# Patient Record
Sex: Female | Born: 1975 | Hispanic: Yes | Smoking: Never smoker
Health system: Southern US, Community
[De-identification: ages and names within clinical notes are randomized; demographics above are authoritative.]

## PROBLEM LIST (undated history)

## (undated) DIAGNOSIS — E119 Type 2 diabetes mellitus without complications: Secondary | ICD-10-CM

## (undated) DIAGNOSIS — U071 COVID-19: Secondary | ICD-10-CM

## (undated) DIAGNOSIS — I959 Hypotension, unspecified: Secondary | ICD-10-CM

## (undated) HISTORY — DX: Type 2 diabetes mellitus without complications: E11.9

## (undated) HISTORY — DX: COVID-19: U07.1

## (undated) HISTORY — PX: NO PAST SURGERIES: SHX2092

## (undated) HISTORY — DX: Hypotension, unspecified: I95.9

---

## 1998-03-12 HISTORY — PX: OOPHORECTOMY: SHX86

## 2019-08-14 ENCOUNTER — Ambulatory Visit (INDEPENDENT_AMBULATORY_CARE_PROVIDER_SITE_OTHER): Payer: Self-pay | Admitting: Cardiology

## 2019-08-14 ENCOUNTER — Other Ambulatory Visit: Payer: Self-pay

## 2019-08-14 ENCOUNTER — Ambulatory Visit (INDEPENDENT_AMBULATORY_CARE_PROVIDER_SITE_OTHER): Payer: Self-pay

## 2019-08-14 ENCOUNTER — Encounter: Payer: Self-pay | Admitting: Cardiology

## 2019-08-14 VITALS — BP 96/62 | HR 78 | Ht 59.0 in | Wt 110.6 lb

## 2019-08-14 DIAGNOSIS — E1100 Type 2 diabetes mellitus with hyperosmolarity without nonketotic hyperglycemic-hyperosmolar coma (NKHHC): Secondary | ICD-10-CM

## 2019-08-14 DIAGNOSIS — R0602 Shortness of breath: Secondary | ICD-10-CM

## 2019-08-14 DIAGNOSIS — R002 Palpitations: Secondary | ICD-10-CM

## 2019-08-14 DIAGNOSIS — R079 Chest pain, unspecified: Secondary | ICD-10-CM

## 2019-08-14 DIAGNOSIS — I9589 Other hypotension: Secondary | ICD-10-CM

## 2019-08-14 MED ORDER — METOPROLOL TARTRATE 100 MG PO TABS
100.0000 mg | ORAL_TABLET | Freq: Once | ORAL | 0 refills | Status: AC
Start: 2019-08-14 — End: 2019-08-14

## 2019-08-14 MED ORDER — MIDODRINE HCL 2.5 MG PO TABS: 2.5000 mg | ORAL_TABLET | Freq: Three times a day (TID) | ORAL | 3 refills | Status: DC

## 2019-08-14 NOTE — Patient Instructions (Signed)
Medication Instructions:  Your physician has recommended you make the following change in your medication:  START: Midodrine 2.5 mg take one tablet by mouth every 8 hours.  *If you need a refill on your cardiac medications before your next appointment, please call your pharmacy*   Lab Work: Your physician recommends that you return for lab work in: TODAY BMP If you have labs (blood work) drawn today and your tests are completely normal, you will receive your results only by: Marland Kitchen MyChart Message (if you have MyChart) OR . A paper copy in the mail If you have any lab test that is abnormal or we need to change your treatment, we will call you to review the results.   Testing/Procedures: Your physician has requested that you have an echocardiogram. Echocardiography is a painless test that uses sound waves to create images of your heart. It provides your doctor with information about the size and shape of your heart and how well your heart's chambers and valves are working. This procedure takes approximately one hour. There are no restrictions for this procedure.  A zio monitor was ordered today. It will remain on for 14 days. You will then return monitor and event diary in provided box. It takes 1-2 weeks for report to be downloaded and returned to [place]. We will call you with the results. If monitor falls off or has orange flashing light, please call Zio for further instructions.   Your cardiac CT will be scheduled at the below location:   [facility] [address] [place],  [address] [phone]   If scheduled at [facility], please arrive at the [facility] main entrance of [facility] 30 minutes prior to test start time. Proceed to the [facility] Radiology Department (first floor) to check-in and test prep.  Please follow these instructions carefully (unless otherwise directed):  On the Night Before the Test: . Be sure to Drink plenty of  water. . Do not consume any caffeinated/decaffeinated beverages or chocolate 12 hours prior to your test. . Do not take any antihistamines 12 hours prior to your test. . If you take Metformin do not take 24 hours prior to test.  On the Day of the Test: . Drink plenty of water. Do not drink any water within one hour of the test. . Do not eat any food 4 hours prior to the test. . You may take your regular medications prior to the test.  . Take metoprolol (Lopressor) two hours prior to test. . HOLD Furosemide/Hydrochlorothiazide morning of the test. . FEMALES- please wear underwire-free bra if available       After the Test: . Drink plenty of water. . After receiving IV contrast, you may experience a mild flushed feeling. This is normal. . On occasion, you may experience a mild rash up to 24 hours after the test. This is not dangerous. If this occurs, you can take Benadryl 25 mg and increase your fluid intake. . If you experience trouble breathing, this can be serious. If it is severe call 911 IMMEDIATELY. If it is mild, please call our office. . If you take any of these medications: Glipizide/Metformin, Avandament, Glucavance, please do not take 48 hours after completing test unless otherwise instructed.   Once we have confirmed authorization from your insurance company, we will call you to set up a date and time for your test.   For non-scheduling related questions, please contact the cardiac imaging nurse navigator should you  have any questions/concerns: Marchia Bond, Cardiac Imaging Nurse Navigator Burley Saver, Interim Cardiac Imaging Nurse Navigator [facility] Direct Office Dial: [phone]   For scheduling needs, including cancellations and rescheduling, please call [phone].      Follow-Up: At [facility], you and your health needs are our priority.  As part of our continuing mission to provide you with exceptional heart care, we have created  designated Provider Care Teams.  These Care Teams include your primary Cardiologist (physician) and Advanced Practice Providers (APPs -  Physician Assistants and Nurse Practitioners) who all work together to provide you with the care you need, when you need it.  We recommend signing up for the patient portal called "MyChart".  Sign up information is provided on this After Visit Summary.  MyChart is used to connect with patients for Virtual Visits (Telemedicine).  Patients are able to view lab/test results, encounter notes, upcoming appointments, etc.  Non-urgent messages can be sent to your provider as well.   To learn more about what you can do with MyChart, go to [email or web address].    Your next appointment:   1 month(s)  The format for your next appointment:   In Person  Provider:   Berniece Salines, DO   Other Instructions

## 2019-08-14 NOTE — Progress Notes (Signed)
Cardiology Office Note:    Date:  08/14/2019   ID:  Catherine Guerrero, DOB 03-02-1976, MRN [id number]  PCP:  Maylon Cos, NP  Cardiologist:  Berniece Salines, DO  Electrophysiologist:  None   Referring MD: Maylon Cos, NP   " I have been experiencing palpitations, shortness of breath and intermittent chest pain"  History of Present Illness:    Catherine Guerrero is a 44 y.o. female with a hx of diabetes mellitus, COVID-19 infection, family history of premature coronary artery disease who presents today to be evaluated for palpitations, chest pain and shortness of breath. The patient is predominantly Spanish-speaking therefore [facility] in person interpreter Effie Shy was present and facilitated this visit. The patient reports that since her recovery for COVID-19 infection several months ago she has had worsening shortness of breath along with palpitations.  She notes that the palpitation abrupt onset of fast heartbeat which last for few seconds prior to resolution.  After this she experienced significant shortness of breath.  She also notes that she has had intermittent hypotension where she reported that her systolic blood pressure has been down in the 50s and she is felt significant lightheaded and feels as if she is going to pass out. Most recently she has had intermittent chest pain which is bothersome for her.  She tells me there is diffuse midsternal pain with no radiation.  It last for few seconds prior to resolution sometimes it lasts up to 5 minutes.  At times she feels as though pressure sensation and sometimes it feels as a tightness.  She is concerned because she has had premature family history of coronary artery disease and wants to get checked out.  Past Medical History:  Diagnosis Date  . COVID-19   . DM (diabetes mellitus) ([place])   . Hypotension     Past Surgical History:  Procedure Laterality Date  . OOPHORECTOMY  2000    Current Medications: Current Meds  Medication Sig   . Albuterol Sulfate (PROAIR RESPICLICK) 546 (90 Base) MCG/ACT AEPB Inhale 2 puffs into the lungs every 4 (four) hours as needed.  Marland Kitchen glipiZIDE (GLUCOTROL) 5 MG tablet Take 5 mg by mouth 2 (two) times daily.  . metFORMIN (GLUCOPHAGE) 1000 MG tablet Take 1,000 mg by mouth 2 (two) times daily with a meal.     Allergies:   Patient has no known allergies.   Social History   Socioeconomic History  . Marital status: Unknown    Spouse name: Not on file  . Number of children: Not on file  . Years of education: Not on file  . Highest education level: Not on file  Occupational History  . Not on file  Tobacco Use  . Smoking status: Never Smoker  . Smokeless tobacco: Never Used  Substance and Sexual Activity  . Alcohol use: Not on file  . Drug use: Not on file  . Sexual activity: Not on file  Other Topics Concern  . Not on file  Social History Narrative  . Not on file   Social Determinants of Health   Financial Resource Strain:   . Difficulty of Paying Living Expenses:   Food Insecurity:   . Worried About Charity fundraiser in the Last Year:   . Arboriculturist in the Last Year:   Transportation Needs:   . Film/video editor (Medical):   Marland Kitchen Lack of Transportation (Non-Medical):   Physical Activity:   . Days of Exercise per Week:   .  Minutes of Exercise per Session:   Stress:   . Feeling of Stress :   Social Connections:   . Frequency of Communication with Friends and Family:   . Frequency of Social Gatherings with Friends and Family:   . Attends Religious Services:   . Active Member of Clubs or Organizations:   . Attends Archivist Meetings:   Marland Kitchen Marital Status:      Family History: The patient's family history is not on file.  ROS:   Review of Systems  Constitution: Negative for decreased appetite, fever and weight gain.  HENT: Negative for congestion, ear discharge, hoarse voice and sore throat.   Eyes: Negative for discharge, redness, vision loss in  right eye and visual halos.  Cardiovascular: Reports chest pain, dyspnea on exertion and palpitations. negative for leg swelling orthopnea respiratory: Negative for cough, hemoptysis, shortness of breath and snoring.   Endocrine: Negative for heat intolerance and polyphagia.  Hematologic/Lymphatic: Negative for bleeding problem. Does not bruise/bleed easily.  Skin: Negative for flushing, nail changes, rash and suspicious lesions.  Musculoskeletal: Negative for arthritis, joint pain, muscle cramps, myalgias, neck pain and stiffness.  Gastrointestinal: Negative for abdominal pain, bowel incontinence, diarrhea and excessive appetite.  Genitourinary: Negative for decreased libido, genital sores and incomplete emptying.  Neurological: Negative for brief paralysis, focal weakness, headaches and loss of balance.  Psychiatric/Behavioral: Negative for altered mental status, depression and suicidal ideas.  Allergic/Immunologic: Negative for HIV exposure and persistent infections.    EKGs/Labs/Other Studies Reviewed:    The following studies were reviewed today:   EKG:  The ekg ordered today demonstrates sinus rhythm, heart rate 70 bpm, no prior EKG for comparison.  Recent Labs: Blood work done at her PCP office on Jul 22, 2019: Chemistry: Glucose 145, BUN 5, creatinine 0.46, sodium 139, potassium 4.5, chloride 104, bicarb 22, calcium 9.5 total protein 6.8, Albumin 4.4, total globulin 2.4, total bilirubin less than 0.2, alk phos 51, AST 16, ALT 13 TSH 2.06 /COVID-19 positive on May 21,2021  Recent Lipid Panel Lipid profile: Total cholesterol 122, triglyceride 87, HDL 49, LDL 56  Physical Exam:    VS:  BP 96/62   Pulse 78   Ht _0  (1.499 m)   Wt 110 lb 9.6 oz (50.2 kg)   SpO2 99%   BMI 22.34 kg/m     Wt Readings from Last 3 Encounters:  08/14/19 110 lb 9.6 oz (50.2 kg)     GEN: Well nourished, well developed in no acute distress HEENT: Normal NECK: No JVD; No carotid  bruits LYMPHATICS: No lymphadenopathy CARDIAC: S1S2 noted,RRR, no murmurs, rubs, gallops RESPIRATORY:  Clear to auscultation without rales, wheezing or rhonchi  ABDOMEN: Soft, non-tender, non-distended, +bowel sounds, no guarding. EXTREMITIES: No edema, No cyanosis, no clubbing MUSCULOSKELETAL:  No deformity  SKIN: Warm and dry NEUROLOGIC:  Alert and oriented x 3, non-focal PSYCHIATRIC:  Normal affect, good insight  ASSESSMENT:    1. Shortness of breath   2. Chest pain of uncertain etiology   3. Palpitations   4. Other specified hypotension   5. Type 2 diabetes mellitus with hyperosmolarity without coma, without long-term current use of insulin (HCC)    PLAN:    I would like to rule out a cardiovascular etiology of this palpitation, therefore at this time I would like to placed a zio patch for 14 days. In additon a transthoracic echocardiogram will be ordered to assess LV/RV function and any structural abnormalities. Once these testing have been performed  amd reviewed further reccomendations will be made. For now, I do reccomend that the patient goes to the nearest ED if  symptoms recur.  I would like to pursue an ischemic evaluation given her chest pain.  I am concerned about this due to her risk factors of diabetes and premature coronary disease in her family.  I do think at this time a coronary CTA would be reasonable.  I discussed with the patient about this testing.  She denies any IV contrast dye allergies and she is willing to proceed with this testing.  In addition I took her blood pressure manually in the office today she is orthostatic negative ( 98/62 mmHg, standing 96/60 mmHg).  But she tells me at home her systolic blood pressure is dropping into the 50s most times.  I like to try the patient on midodrine 2.5 mg every 8 hours to see if this is going to help.  I did educate her on how to take this medicine she is agreeable to try.  Type 2 diabetes continue her current  medication regimen by her PCP.   The patient is in agreement with the above plan. The patient left the office in stable condition.  The patient will follow up in 1 month or sooner if needed.   Medication Adjustments/Labs and Tests Ordered: Current medicines are reviewed at length with the patient today.  Concerns regarding medicines are outlined above.  Orders Placed This Encounter  Procedures  . CT CORONARY MORPH W/CTA COR W/SCORE W/CA W/CM &/OR WO/CM  . CT CORONARY FRACTIONAL FLOW RESERVE DATA PREP  . CT CORONARY FRACTIONAL FLOW RESERVE FLUID ANALYSIS  . Basic Metabolic Panel (BMET)  . LONG TERM MONITOR (3-14 DAYS)  . EKG 12-Lead  . ECHOCARDIOGRAM COMPLETE   Meds ordered this encounter  Medications  . midodrine (PROAMATINE) 2.5 MG tablet    Sig: Take 1 tablet (2.5 mg total) by mouth 3 (three) times daily with meals.    Dispense:  270 tablet    Refill:  3  . metoprolol tartrate (LOPRESSOR) 100 MG tablet    Sig: Take 1 tablet (100 mg total) by mouth once for 1 dose. Take two hours prior to cardiac CT    Dispense:  1 tablet    Refill:  0    Patient Instructions  Medication Instructions:  Your physician has recommended you make the following change in your medication:  START: Midodrine 2.5 mg take one tablet by mouth every 8 hours.  *If you need a refill on your cardiac medications before your next appointment, please call your pharmacy*   Lab Work: Your physician recommends that you return for lab work in: TODAY BMP If you have labs (blood work) drawn today and your tests are completely normal, you will receive your results only by: Marland Kitchen MyChart Message (if you have MyChart) OR . A paper copy in the mail If you have any lab test that is abnormal or we need to change your treatment, we will call you to review the results.   Testing/Procedures: Your physician has requested that you have an echocardiogram. Echocardiography is a painless test that uses sound waves to create  images of your heart. It provides your doctor with information about the size and shape of your heart and how well your heart's chambers and valves are working. This procedure takes approximately one hour. There are no restrictions for this procedure.  A zio monitor was ordered today. It will remain on for 14 days. You  will then return monitor and event diary in provided box. It takes 1-2 weeks for report to be downloaded and returned to [place]. We will call you with the results. If monitor falls off or has orange flashing light, please call Zio for further instructions.   Your cardiac CT will be scheduled at the below location:   [facility] [address] [place], [place] [address] [phone]   If scheduled at [facility], please arrive at the [facility] main entrance of [facility] 30 minutes prior to test start time. Proceed to the [facility] Radiology Department (first floor) to check-in and test prep.  Please follow these instructions carefully (unless otherwise directed):  On the Night Before the Test: . Be sure to Drink plenty of water. . Do not consume any caffeinated/decaffeinated beverages or chocolate 12 hours prior to your test. . Do not take any antihistamines 12 hours prior to your test. . If you take Metformin do not take 24 hours prior to test.  On the Day of the Test: . Drink plenty of water. Do not drink any water within one hour of the test. . Do not eat any food 4 hours prior to the test. . You may take your regular medications prior to the test.  . Take metoprolol (Lopressor) two hours prior to test. . HOLD Furosemide/Hydrochlorothiazide morning of the test. . FEMALES- please wear underwire-free bra if available       After the Test: . Drink plenty of water. . After receiving IV contrast, you may experience a mild flushed feeling. This is normal. . On occasion, you may experience a mild rash up to 24 hours after the test. This is  not dangerous. If this occurs, you can take Benadryl 25 mg and increase your fluid intake. . If you experience trouble breathing, this can be serious. If it is severe call 911 IMMEDIATELY. If it is mild, please call our office. . If you take any of these medications: Glipizide/Metformin, Avandament, Glucavance, please do not take 48 hours after completing test unless otherwise instructed.   Once we have confirmed authorization from your insurance company, we will call you to set up a date and time for your test.   For non-scheduling related questions, please contact the cardiac imaging nurse navigator should you have any questions/concerns: Marchia Bond, Cardiac Imaging Nurse Navigator Burley Saver, Interim Cardiac Imaging Nurse [facility] Direct Office Dial: [phone]   For scheduling needs, including cancellations and rescheduling, please call [phone].      Follow-Up: At [facility], you and your health needs are our priority.  As part of our continuing mission to provide you with exceptional heart care, we have created designated Provider Care Teams.  These Care Teams include your primary Cardiologist (physician) and Advanced Practice Providers (APPs -  Physician Assistants and Nurse Practitioners) who all work together to provide you with the care you need, when you need it.  We recommend signing up for the patient portal called "MyChart".  Sign up information is provided on this After Visit Summary.  MyChart is used to connect with patients for Virtual Visits (Telemedicine).  Patients are able to view lab/test results, encounter notes, upcoming appointments, etc.  Non-urgent messages can be sent to your provider as well.   To learn more about what you can do with MyChart, go to [email or web address].    Your next appointment:   1 month(s)  The format for your  next appointment:   In Person  Provider:   Berniece Salines, DO   Other  Instructions      Adopting a Healthy Lifestyle.  Know what a healthy weight is for you (roughly BMI <25) and aim to maintain this   Aim for 7+ servings of fruits and vegetables daily   65-80+ fluid ounces of water or unsweet tea for healthy kidneys   Limit to max 1 drink of alcohol per day; avoid smoking/tobacco   Limit animal fats in diet for cholesterol and heart health - choose grass fed whenever available   Avoid highly processed foods, and foods high in saturated/trans fats   Aim for low stress - take time to unwind and care for your mental health   Aim for 150 min of moderate intensity exercise weekly for heart health, and weights twice weekly for bone health   Aim for 7-9 hours of sleep daily   When it comes to diets, agreement about the perfect plan isnt easy to find, even among the experts. Experts at the [employer] developed an idea known as the Healthy Eating Plate. Just imagine a plate divided into logical, healthy portions.   The emphasis is on diet quality:   Load up on vegetables and fruits - one-half of your plate: Aim for color and variety, and remember that potatoes dont count.   Go for whole grains - one-quarter of your plate: Whole wheat, barley, wheat berries, quinoa, oats, brown rice, and foods made with them. If you want pasta, go with whole wheat pasta.   Protein power - one-quarter of your plate: Fish, chicken, beans, and nuts are all healthy, versatile protein sources. Limit red meat.   The diet, however, does go beyond the plate, offering a few other suggestions.   Use healthy plant oils, such as olive, canola, soy, corn, sunflower and peanut. Check the labels, and avoid partially hydrogenated oil, which have unhealthy trans fats.   If youre thirsty, drink water. Coffee and tea are good in moderation, but skip sugary drinks and limit milk and dairy products to one or two daily servings.   The type of carbohydrate in the diet  is more important than the amount. Some sources of carbohydrates, such as vegetables, fruits, whole grains, and beans-are healthier than others.   Finally, stay active  Signed, Berniece Salines, DO  08/14/2019 10:27 PM    [place]

## 2019-08-15 LAB — BASIC METABOLIC PANEL
BUN/Creatinine Ratio: 17 (ref 9–23)
BUN: 10 mg/dL (ref 6–24)
CO2: 24 mmol/L (ref 20–29)
Calcium: 10.2 mg/dL (ref 8.7–10.2)
Chloride: 100 mmol/L (ref 96–106)
Creatinine, Ser: 0.59 mg/dL (ref 0.57–1.00)
GFR calc Af Amer: 130 mL/min/{1.73_m2} (ref >59)
GFR calc non Af Amer: 113 mL/min/{1.73_m2} (ref >59)
Glucose: 132 mg/dL — ABNORMAL HIGH (ref 65–99)
Potassium: 5.2 mmol/L (ref 3.5–5.2)
Sodium: 139 mmol/L (ref 134–144)

## 2019-08-17 ENCOUNTER — Telehealth: Payer: Self-pay

## 2019-08-17 NOTE — Telephone Encounter (Signed)
-----   Message from Berniece Salines, DO sent at 08/15/2019 10:30 AM EDT ----- Blood work shows elevated glucose but otherwise normal.

## 2019-08-17 NOTE — Telephone Encounter (Signed)
Left message on patients voicemail to please return our call.   

## 2019-08-18 NOTE — Telephone Encounter (Signed)
Spoke with patient regarding results and recommendation.  Patient verbalizes understanding and is agreeable to plan of care. Advised patient to call back with any issues or concerns.  

## 2019-09-04 ENCOUNTER — Ambulatory Visit (INDEPENDENT_AMBULATORY_CARE_PROVIDER_SITE_OTHER): Payer: Self-pay

## 2019-09-04 ENCOUNTER — Other Ambulatory Visit: Payer: Self-pay

## 2019-09-04 DIAGNOSIS — R0602 Shortness of breath: Secondary | ICD-10-CM

## 2019-09-04 NOTE — Progress Notes (Signed)
2D Echocardiogram has been performed. [facility] Cyla Haluska RDCS 3:39 09/04/19

## 2019-09-07 ENCOUNTER — Telehealth: Payer: Self-pay

## 2019-09-07 NOTE — Telephone Encounter (Signed)
Spoke with patient regarding results and recommendation.  Patient verbalizes understanding and is agreeable to plan of care. Advised patient to call back with any issues or concerns.  

## 2019-09-07 NOTE — Telephone Encounter (Signed)
-----   Message from Loel Dubonnet, NP sent at 09/07/2019  7:44 AM EDT ----- Echocardiogram shows normal heart pumping function. No significant valvular abnormalities. Good result!

## 2019-09-18 ENCOUNTER — Ambulatory Visit: Payer: Self-pay | Admitting: Cardiology

## 2019-09-21 ENCOUNTER — Telehealth: Payer: Self-pay

## 2019-09-21 NOTE — Telephone Encounter (Signed)
-----   Message from Berniece Salines, DO sent at 09/21/2019 10:34 AM EDT ----- Doristine Devoid news -ZIO monitor was normal with no evidence of arrhythmias.

## 2019-09-21 NOTE — Telephone Encounter (Signed)
Spoke with patient regarding results and recommendation.  Patient verbalizes understanding and is agreeable to plan of care. Advised patient to call back with any issues or concerns.  

## 2020-02-25 ENCOUNTER — Other Ambulatory Visit: Payer: Self-pay | Admitting: *Deleted

## 2020-02-25 DIAGNOSIS — Z1231 Encounter for screening mammogram for malignant neoplasm of breast: Secondary | ICD-10-CM

## 2020-02-29 ENCOUNTER — Ambulatory Visit
Admission: RE | Admit: 2020-02-29 | Discharge: 2020-02-29 | Disposition: A | Discharge status: Home / Self Care | Payer: No Typology Code available for payment source | Source: Ambulatory Visit | Attending: Nurse Practitioner | Admitting: Nurse Practitioner

## 2020-02-29 ENCOUNTER — Other Ambulatory Visit: Payer: Self-pay

## 2020-02-29 DIAGNOSIS — Z1231 Encounter for screening mammogram for malignant neoplasm of breast: Secondary | ICD-10-CM

## 2021-07-16 ENCOUNTER — Encounter (HOSPITAL_BASED_OUTPATIENT_CLINIC_OR_DEPARTMENT_OTHER): Payer: Self-pay

## 2021-07-16 ENCOUNTER — Other Ambulatory Visit: Payer: Self-pay

## 2021-07-16 ENCOUNTER — Emergency Department (HOSPITAL_BASED_OUTPATIENT_CLINIC_OR_DEPARTMENT_OTHER)
Admission: EM | Admit: 2021-07-16 | Discharge: 2021-07-16 | Disposition: A | Discharge status: Home / Self Care | Payer: 59 | Attending: Emergency Medicine | Admitting: Emergency Medicine

## 2021-07-16 ENCOUNTER — Emergency Department (HOSPITAL_BASED_OUTPATIENT_CLINIC_OR_DEPARTMENT_OTHER): Payer: 59

## 2021-07-16 DIAGNOSIS — R110 Nausea: Secondary | ICD-10-CM | POA: Insufficient documentation

## 2021-07-16 DIAGNOSIS — S76212A Strain of adductor muscle, fascia and tendon of left thigh, initial encounter: Secondary | ICD-10-CM | POA: Insufficient documentation

## 2021-07-16 DIAGNOSIS — I10 Essential (primary) hypertension: Secondary | ICD-10-CM | POA: Diagnosis not present

## 2021-07-16 DIAGNOSIS — S79922A Unspecified injury of left thigh, initial encounter: Secondary | ICD-10-CM | POA: Diagnosis present

## 2021-07-16 DIAGNOSIS — R1032 Left lower quadrant pain: Secondary | ICD-10-CM | POA: Diagnosis not present

## 2021-07-16 DIAGNOSIS — Z79899 Other long term (current) drug therapy: Secondary | ICD-10-CM | POA: Insufficient documentation

## 2021-07-16 DIAGNOSIS — E119 Type 2 diabetes mellitus without complications: Secondary | ICD-10-CM | POA: Insufficient documentation

## 2021-07-16 DIAGNOSIS — X58XXXA Exposure to other specified factors, initial encounter: Secondary | ICD-10-CM | POA: Diagnosis not present

## 2021-07-16 DIAGNOSIS — Z7984 Long term (current) use of oral hypoglycemic drugs: Secondary | ICD-10-CM | POA: Insufficient documentation

## 2021-07-16 LAB — URINALYSIS, ROUTINE W REFLEX MICROSCOPIC
Bilirubin Urine: NEGATIVE
Glucose, UA: NEGATIVE mg/dL
Hgb urine dipstick: NEGATIVE
Ketones, ur: NEGATIVE mg/dL
Leukocytes,Ua: NEGATIVE
Nitrite: NEGATIVE
Specific Gravity, Urine: 1.02 (ref 1.005–1.030)
pH: 6.5 (ref 5.0–8.0)

## 2021-07-16 LAB — CBC
HCT: 32.7 % — ABNORMAL LOW (ref 36.0–46.0)
Hemoglobin: 10.7 g/dL — ABNORMAL LOW (ref 12.0–15.0)
MCH: 26.2 pg (ref 26.0–34.0)
MCHC: 32.7 g/dL (ref 30.0–36.0)
MCV: 80.1 fL (ref 80.0–100.0)
Platelets: 267 10*3/uL (ref 150–400)
RBC: 4.08 MIL/uL (ref 3.87–5.11)
RDW: 14.6 % (ref 11.5–15.5)
WBC: 10.9 10*3/uL — ABNORMAL HIGH (ref 4.0–10.5)
nRBC: 0 % (ref 0.0–0.2)

## 2021-07-16 LAB — COMPREHENSIVE METABOLIC PANEL
ALT: 7 U/L (ref 0–44)
AST: 10 U/L — ABNORMAL LOW (ref 15–41)
Albumin: 4.3 g/dL (ref 3.5–5.0)
Alkaline Phosphatase: 31 U/L — ABNORMAL LOW (ref 38–126)
Anion gap: 10 (ref 5–15)
BUN: 11 mg/dL (ref 6–20)
CO2: 23 mmol/L (ref 22–32)
Calcium: 9.6 mg/dL (ref 8.9–10.3)
Chloride: 107 mmol/L (ref 98–111)
Creatinine, Ser: 0.5 mg/dL (ref 0.44–1.00)
GFR, Estimated: >60 mL/min (ref >60)
Glucose, Bld: 59 mg/dL — ABNORMAL LOW (ref 70–99)
Potassium: 4 mmol/L (ref 3.5–5.1)
Sodium: 140 mmol/L (ref 135–145)
Total Bilirubin: 0.3 mg/dL (ref 0.3–1.2)
Total Protein: 6.8 g/dL (ref 6.5–8.1)

## 2021-07-16 LAB — CBG MONITORING, ED: Glucose-Capillary: 84 mg/dL (ref 70–99)

## 2021-07-16 LAB — PREGNANCY, URINE: Preg Test, Ur: NEGATIVE

## 2021-07-16 LAB — LIPASE, BLOOD: Lipase: 40 U/L (ref 11–51)

## 2021-07-16 MED ORDER — ONDANSETRON HCL 4 MG/2ML IJ SOLN
4.0000 mg | Freq: Once | INTRAMUSCULAR | Status: DC
  Filled 2021-07-16: qty 2

## 2021-07-16 MED ORDER — IBUPROFEN 600 MG PO TABS: 600.0000 mg | ORAL_TABLET | Freq: Four times a day (QID) | ORAL | 0 refills | Status: AC | PRN

## 2021-07-16 MED ORDER — ACETAMINOPHEN 500 MG PO TABS
1000.0000 mg | ORAL_TABLET | Freq: Once | ORAL | Status: AC
  Administered 2021-07-16: 1000 mg via ORAL
  Filled 2021-07-16: qty 2

## 2021-07-16 MED ORDER — IOHEXOL 300 MG/ML  SOLN
100.0000 mL | Freq: Once | INTRAMUSCULAR | Status: AC | PRN
  Administered 2021-07-16: 100 mL via INTRAVENOUS

## 2021-07-16 MED ORDER — MORPHINE SULFATE (PF) 4 MG/ML IV SOLN
4.0000 mg | Freq: Once | INTRAVENOUS | Status: DC
  Filled 2021-07-16: qty 1

## 2021-07-16 MED ORDER — CYCLOBENZAPRINE HCL 10 MG PO TABS: 10.0000 mg | ORAL_TABLET | Freq: Two times a day (BID) | ORAL | 0 refills | Status: DC | PRN

## 2021-07-16 NOTE — ED Triage Notes (Signed)
Pt presents with a 3 day hx of LLQ abd pain with radiation down L leg. Pain is palliated when lying down with a pillow under L leg. Pt reports nausea. Denies vomiting, diarrhea or urinary symptoms. Pt has tried OTC pain medication without relief.  ?

## 2021-07-16 NOTE — ED Provider Notes (Signed)
?[facility] ?Provider Note ? ? ?CSN: [id number] ?Arrival date & time: 07/16/21  1337 ? ?  ? ?History ? ?Chief Complaint  ?Patient presents with  ? Abdominal Pain  ? ? ?Catherine Guerrero is a 46 y.o. female. ? ?The history is provided by the patient and medical records. The history is limited by a language barrier. A language interpreter was used.  ?Abdominal Pain ? ?46 year old Hispanic female with history of diabetes, hypertension, presenting with complaints of abdominal pain.  History obtained through daughter who serves as a language interpreter per patient request.  For the past few days patient has had pain to her left lower abdomen and left groin area that radiates down her left leg as well as up left arm.  Pain is sharp throbbing seems to be persistent and worsening with movement.  There are only a few positions that she feels more comfortable with.  She tries taking over-the-counter medication including Tylenol and ibuprofen without adequate relief.  She does not endorse any fever or chills no nausea vomiting or diarrhea no urinary symptoms no blood in urine no change in her bowel bladder status.  The pain is intense she does endorse a mild nausea.  She has had somewhat of a similar symptoms like this in the past.  Currently rates the pain a 6 out of 10.  No back pain.  No recent trauma ? ?Home Medications ?Prior to Admission medications   ?Medication Sig Start Date End Date Taking? Authorizing Provider  ?Albuterol Sulfate (PROAIR RESPICLICK) 163 (90 Base) MCG/ACT AEPB Inhale 2 puffs into the lungs every 4 (four) hours as needed.    [provider]  ?glipiZIDE (GLUCOTROL) 5 MG tablet Take 5 mg by mouth 2 (two) times daily.    [provider]  ?metFORMIN (GLUCOPHAGE) 1000 MG tablet Take 1,000 mg by mouth 2 (two) times daily with a meal.    [provider]  ?metoprolol tartrate (LOPRESSOR) 100 MG tablet Take 1 tablet (100 mg total) by mouth once for 1 dose.  Take two hours prior to cardiac CT 08/14/19 08/14/19  Tobb, Godfrey Pick, DO  ?midodrine (PROAMATINE) 2.5 MG tablet Take 1 tablet (2.5 mg total) by mouth 3 (three) times daily with meals. 08/14/19   Berniece Salines, DO  ?   ? ?Allergies    ?Patient has no known allergies.   ? ?Review of Systems   ?Review of Systems  ?Gastrointestinal:  Positive for abdominal pain.  ?All other systems reviewed and are negative. ? ?Physical Exam ?Updated Vital Signs ?BP 114/75   Pulse 79   Temp 98.8 ?F (37.1 ?C)   Resp 16   Ht '4\' 10"'$  (1.473 m)   Wt 52.2 kg   SpO2 100%   BMI 24.04 kg/m?  ?Physical Exam ?Vitals and nursing note reviewed.  ?Constitutional:   ?   General: She is not in acute distress. ?   Appearance: She is well-developed.  ?HENT:  ?   Head: Atraumatic.  ?Eyes:  ?   Conjunctiva/sclera: Conjunctivae normal.  ?Cardiovascular:  ?   Rate and Rhythm: Normal rate and regular rhythm.  ?Pulmonary:  ?   Effort: Pulmonary effort is normal.  ?Abdominal:  ?   General: Abdomen is flat.  ?   Tenderness: There is abdominal tenderness in the left lower quadrant.  ?   Hernia: No hernia is present.  ?Musculoskeletal:     ?   General: Tenderness (Some tenderness about the left hip with normal hip range  of motion and negative left straight leg raise) present.  ?   Cervical back: Neck supple.  ?   Comments: No tenderness to lumbar spine  ?Skin: ?   Capillary Refill: Capillary refill takes less than 2 seconds.  ?   Findings: No rash.  ?Neurological:  ?   Mental Status: She is alert.  ?Psychiatric:     ?   Mood and Affect: Mood normal.  ? ? ?ED Results / Procedures / Treatments   ?Labs ?(all labs ordered are listed, but only abnormal results are displayed) ?Labs Reviewed  ?COMPREHENSIVE METABOLIC PANEL - Abnormal; Notable for the following components:  ?    Result Value  ? Glucose, Bld 59 (*)   ? AST 10 (*)   ? Alkaline Phosphatase 31 (*)   ? All other components within normal limits  ?CBC - Abnormal; Notable for the following components:  ? WBC 10.9  (*)   ? Hemoglobin 10.7 (*)   ? HCT 32.7 (*)   ? All other components within normal limits  ?URINALYSIS, ROUTINE W REFLEX MICROSCOPIC - Abnormal; Notable for the following components:  ? Protein, ur TRACE (*)   ? All other components within normal limits  ?LIPASE, BLOOD  ?PREGNANCY, URINE  ?CBG MONITORING, ED  ? ? ?EKG ?None ? ?Radiology ?CT ABDOMEN PELVIS W CONTRAST ? ?Result Date: 07/16/2021 ?CLINICAL DATA:  Left lower quadrant pain for 2 days. EXAM: CT ABDOMEN AND PELVIS WITH CONTRAST TECHNIQUE: Multidetector CT imaging of the abdomen and pelvis was performed using the standard protocol following bolus administration of intravenous contrast. RADIATION DOSE REDUCTION: This exam was performed according to the departmental dose-optimization program which includes automated exposure control, adjustment of the mA and/or kV according to patient size and/or use of iterative reconstruction technique. CONTRAST:  160m OMNIPAQUE IOHEXOL 300 MG/ML  SOLN COMPARISON:  None Available. FINDINGS: Lower Chest: No acute findings. Hepatobiliary: No hepatic masses identified. Gallbladder is unremarkable. No evidence of biliary ductal dilatation. Pancreas:  No mass or inflammatory changes. Spleen: Within normal limits in size and appearance. Adrenals/Urinary Tract: No masses identified. No evidence of ureteral calculi or hydronephrosis. Stomach/Bowel: No evidence of obstruction, inflammatory process or abnormal fluid collections. Normal appendix visualized. Vascular/Lymphatic: No pathologically enlarged lymph nodes. No acute vascular findings. Reproductive: Prior hysterectomy. A benign-appearing left ovarian cyst is seen which measures 3.9 x 3.6 cm. This is most likely physiologic in a reproductive age female. No evidence of inflammatory changes or abnormal fluid collections. Other:  None. Musculoskeletal:  No suspicious bone lesions identified. IMPRESSION: 3.9 cm benign-appearing left ovarian cyst, most likely physiologic in a  reproductive age female. No follow-up imaging recommended. Note: This recommendation does not apply to premenarchal patients and to those with increased risk (genetic, family history, elevated tumor markers or other high-risk factors) of ovarian cancer. Reference: JACR 2020 Feb; 17(2):248-254 Electronically Signed   By: JMarlaine HindM.D.   On: 07/16/2021 16:13   ? ?Procedures ?Procedures  ? ? ?Medications Ordered in ED ?Medications  ?morphine (PF) 4 MG/ML injection 4 mg (4 mg Intravenous Not Given 07/16/21 1507)  ?ondansetron (ZOFRAN) injection 4 mg (4 mg Intravenous Not Given 07/16/21 1514)  ?acetaminophen (TYLENOL) tablet 1,000 mg (1,000 mg Oral Given 07/16/21 1543)  ?iohexol (OMNIPAQUE) 300 MG/ML solution 100 mL (100 mLs Intravenous Contrast Given 07/16/21 1548)  ? ? ?ED Course/ Medical Decision Making/ A&P ?  ?                        ?  Medical Decision Making ?Amount and/or Complexity of Data Reviewed ?Labs: ordered. ?Radiology: ordered. ? ?Risk ?OTC drugs. ?Prescription drug management. ? ? ?BP 114/75   Pulse 79   Temp 98.8 ?F (37.1 ?C)   Resp 16   Ht '4\' 10"'$  (1.473 m)   Wt 52.2 kg   SpO2 100%   BMI 24.04 kg/m?  ? ?2:36 PM ?Patient here with pain to left groin/left lower quadrant for the past 3 days.  Pain also radiates down towards her left leg.  Pain is reproducible on exam but localized more towards her left inguinal region.  No obvious hernia noted.  Left hip with full range of motion.  Left leg is neurovascular intact.  Negative straight leg raise. ? ?4:22 PM ?Lab and imaging independently viewed and interpreted by me and agrees with radiologist interpretation.  Patient's labs are reassuring, white count mildly elevated at 10.9, urinalysis without signs of new tract infection, pregnancy test is negative however patient has had prior hysterectomy.  An abdominal pelvis CT scan showing a benign 3.9 cm left ovarian cyst but no other concerning finding noted. ? ?Blood sugar is 59, will recheck blood sugar. ? ?4:53  PM ?Recheck CBG is 84.  Months time since patient has radicular pain, will provide symptomatic treatment which include anti-inflammatory medication and muscle relaxant.  Due to history of diabetes, I do not think steroid w

## 2021-07-16 NOTE — ED Notes (Signed)
Pt's CBG result was 84. Informed Robin - Medic ?

## 2021-11-29 IMAGING — MG DIGITAL SCREENING BILAT W/ TOMO W/ CAD
8 series · 8 of 24 positions shown · non-contrast
Comparison: Previous exam(s).

CLINICAL DATA: Screening.

EXAM:
DIGITAL SCREENING BILATERAL MAMMOGRAM WITH TOMO AND CAD

[R MLO synth-2D]
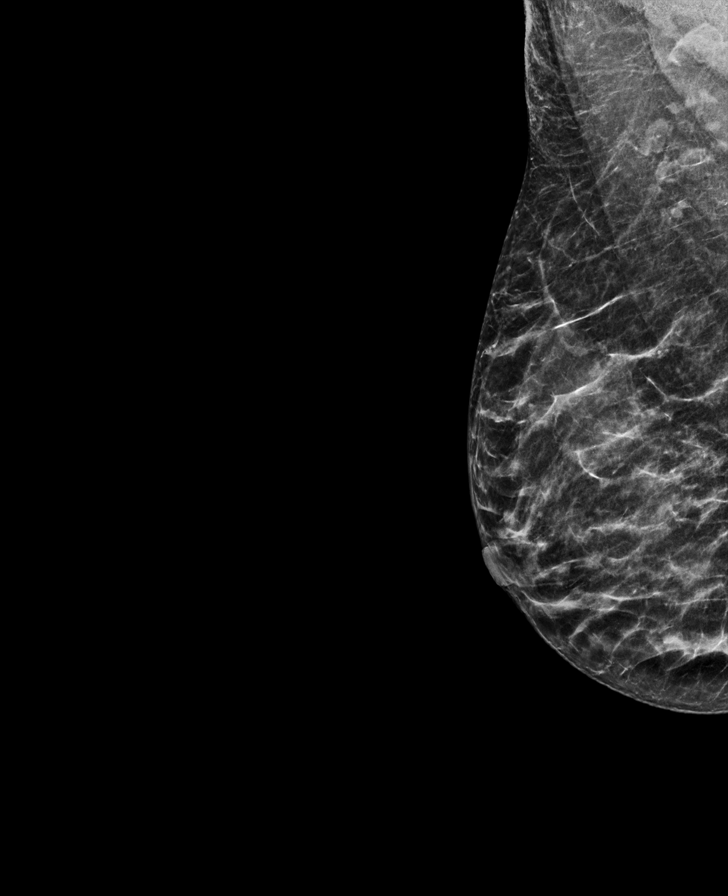

[L MLO synth-2D]
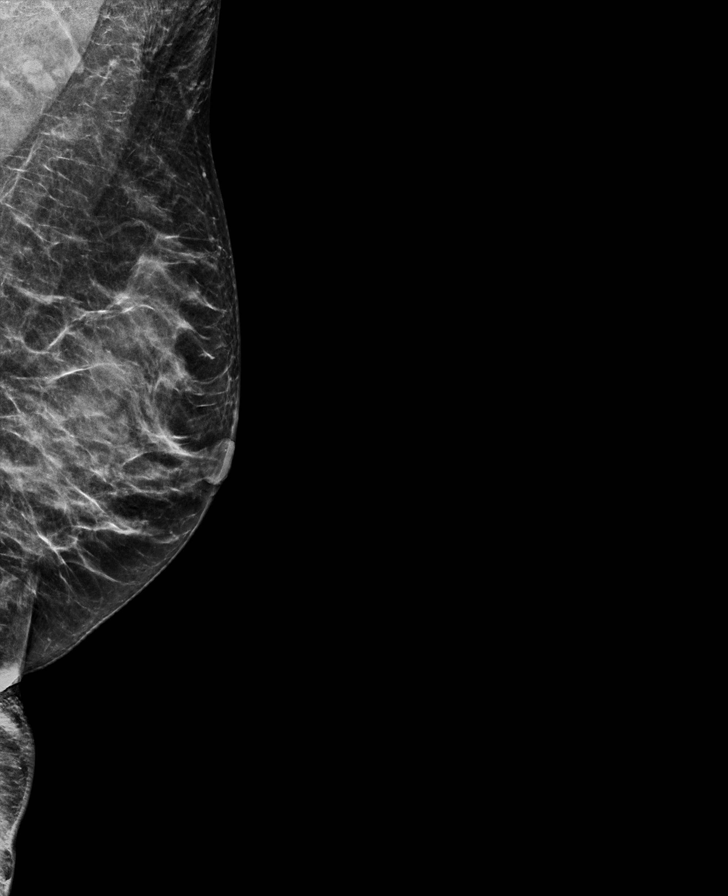

[R CC synth-2D]
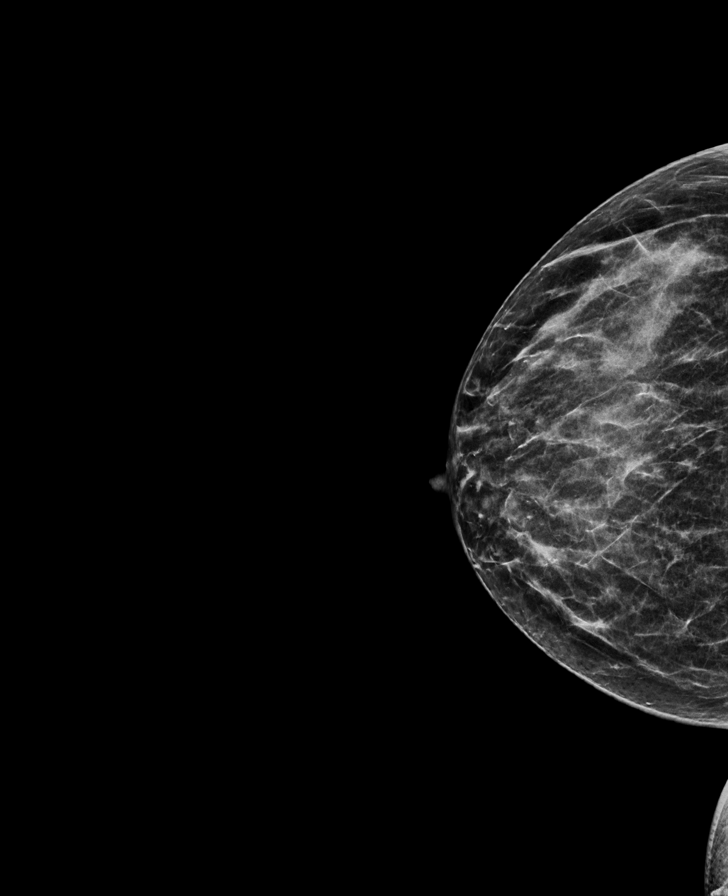

[L CC synth-2D]
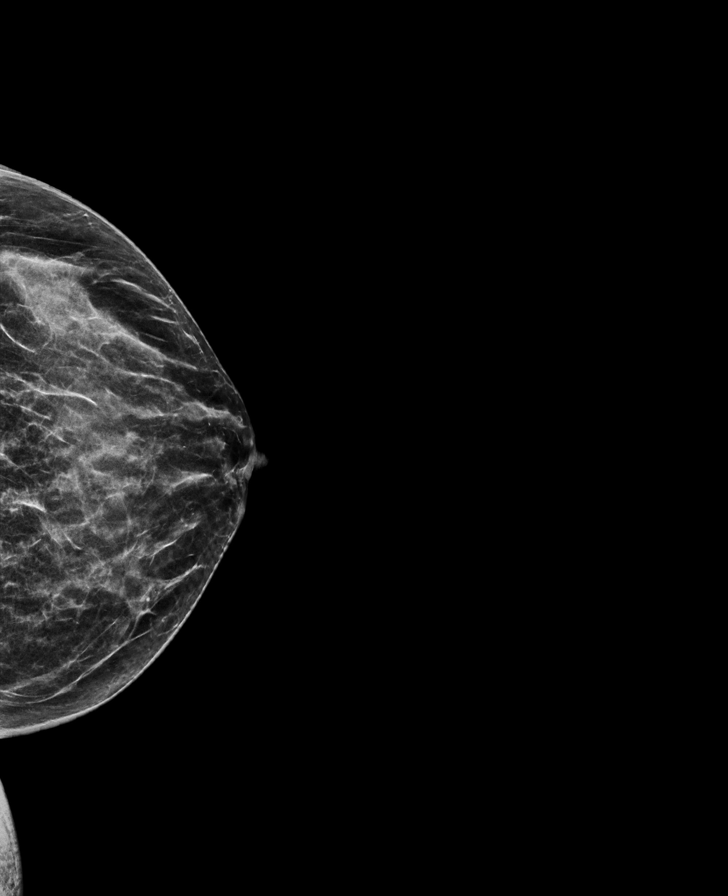

[L MLO tomo · tomo slice 27/54.0]
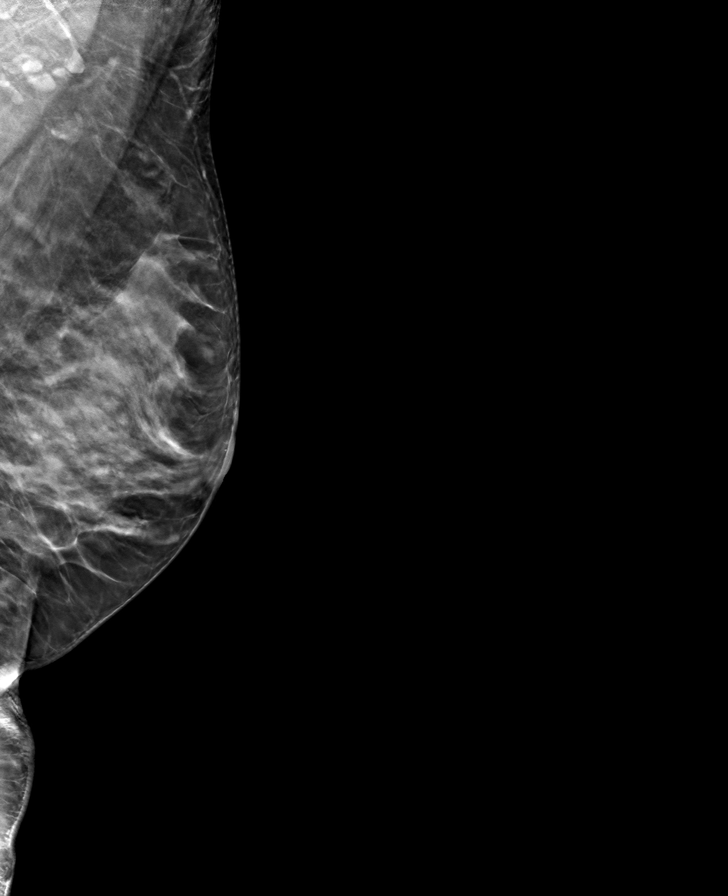

[R MLO tomo · tomo slice 25/50.0]
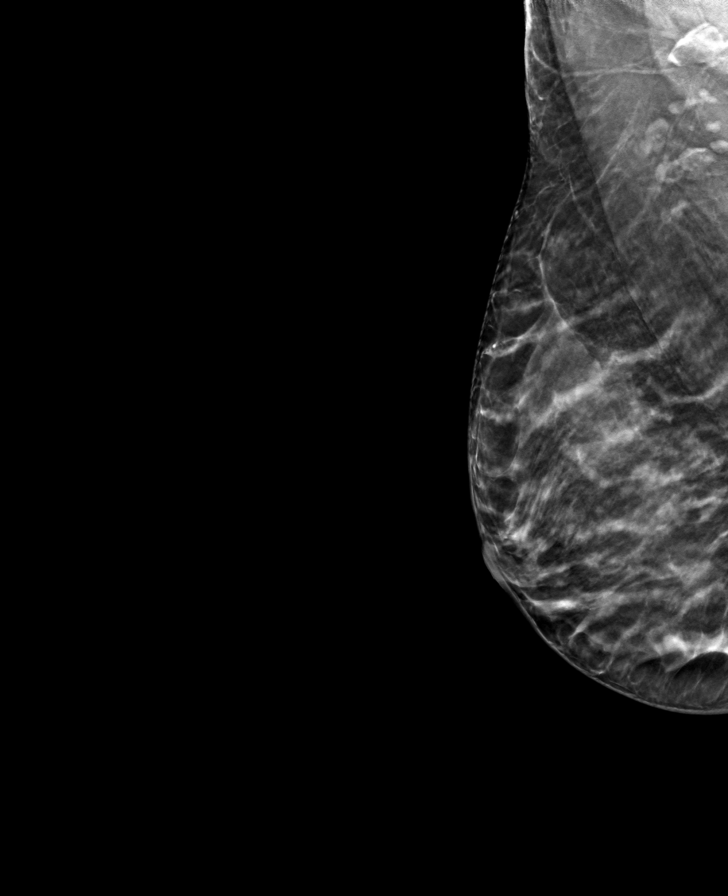

[R CC tomo · tomo slice 23/45.0]
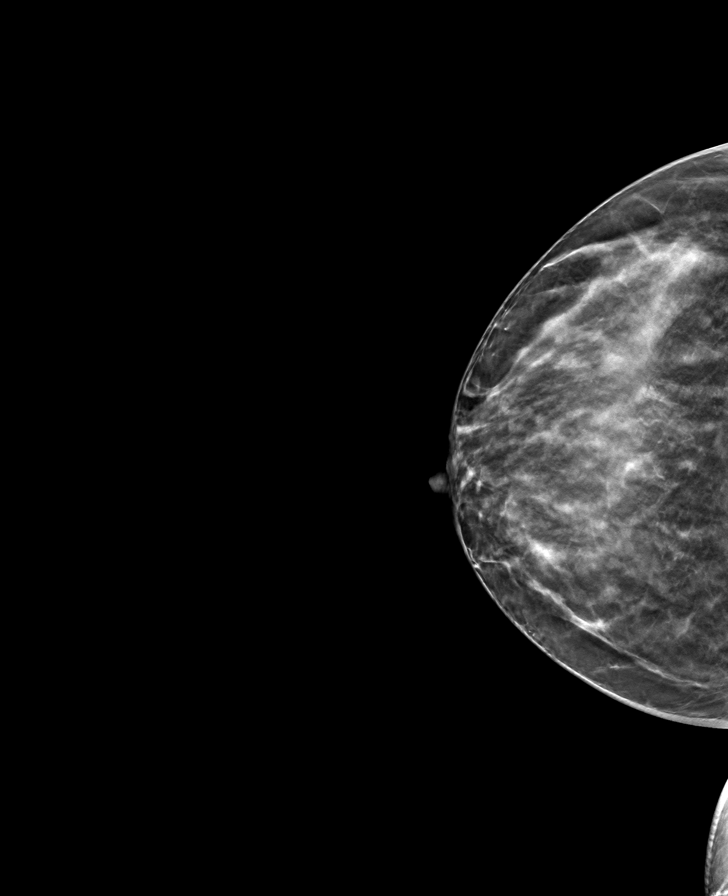

[L CC tomo · tomo slice 25/48.0]
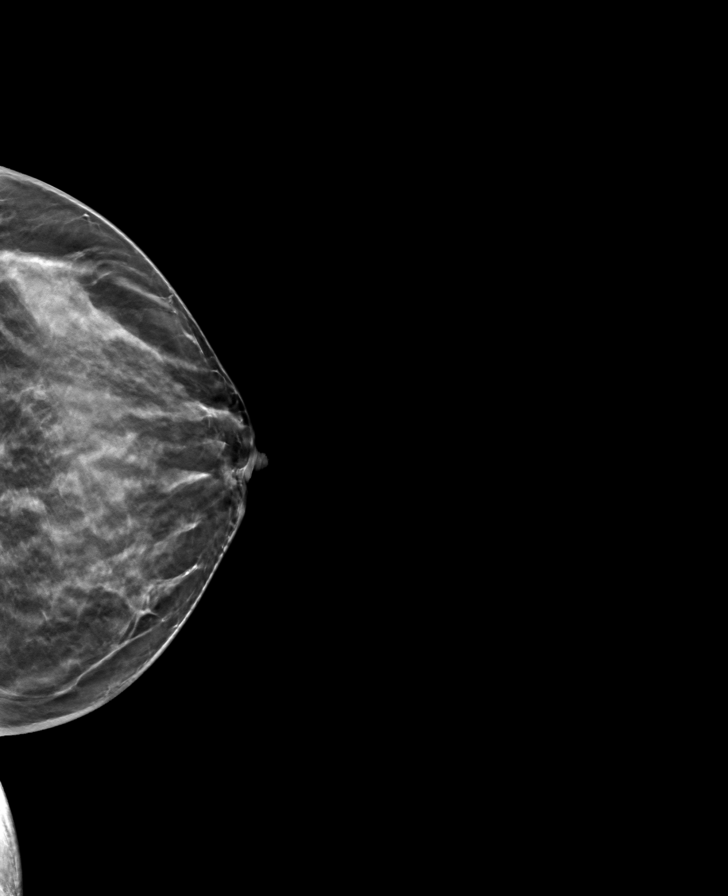

[8 of 24 positions shown; findings below may reference images not displayed]

ACR Breast Density Category c: The breast tissue is heterogeneously
dense, which may obscure small masses.
FINDINGS: There are no findings suspicious for malignancy. Images were
processed with CAD.
IMPRESSION: No mammographic evidence of malignancy. A result letter of this
screening mammogram will be mailed directly to the patient.

RECOMMENDATION:
Screening mammogram in one year. (Code:[id number])

BI-RADS CATEGORY  1: Negative.

## 2022-02-13 ENCOUNTER — Ambulatory Visit: Payer: Commercial Managed Care - HMO | Admitting: Podiatry

## 2022-02-13 ENCOUNTER — Ambulatory Visit (INDEPENDENT_AMBULATORY_CARE_PROVIDER_SITE_OTHER): Payer: Commercial Managed Care - HMO

## 2022-02-13 DIAGNOSIS — D3610 Benign neoplasm of peripheral nerves and autonomic nervous system, unspecified: Secondary | ICD-10-CM

## 2022-02-13 DIAGNOSIS — Q742 Other congenital malformations of lower limb(s), including pelvic girdle: Secondary | ICD-10-CM

## 2022-02-13 DIAGNOSIS — M7751 Other enthesopathy of right foot: Secondary | ICD-10-CM

## 2022-02-13 DIAGNOSIS — S90851A Superficial foreign body, right foot, initial encounter: Secondary | ICD-10-CM

## 2022-02-13 NOTE — Progress Notes (Signed)
Subjective:  Patient ID: Catherine Guerrero, female    DOB: [birth year],  MRN: [id number]  CC: pain in Right plantar forefoot, feels like something inside foot   46 y.o. female presents with concern for pain in the right plantar forefoot laterally around the fifth metatarsal head.  She says this has been going on for about 2 months.  She does not recall stepping on anything or anything poking into the foot that she can recall.  She has not had a wound to the bottom of the foot.  She is not sure that there was an injury that occurred that might have caused this to begin.  She says the pain is primarily when she is walking with pressure on the area feels like there is something that she is stepping on inside the foot which has not been deeper previously but is working its way towards the skin almost.  Pain worse when walking barefoot.  Past Medical History:  Diagnosis Date   COVID-19    DM (diabetes mellitus) ([place])    Hypotension     No Known Allergies  ROS: Negative except as per HPI above  Objective:  General: AAO x3, NAD  Dermatological: Significant hyperkeratotic lesion or porokeratosis at the area of pain with palpation on the plantar aspect of the fifth metatarsal head area right foot  Vascular:  Dorsalis Pedis artery and Posterior Tibial artery pedal pulses are 2/4 bilateral.  Capillary fill time < 3 sec to all digits.   Neruologic: Grossly intact via light touch bilateral. Protective threshold intact to all sites bilateral.   Musculoskeletal: Pain with palpation at the plantar aspect of the right lateral forefoot some fifth metatarsal head area.  There is a prominent object that is palpable possibly a foreign body versus osseous fragment is bone spur off the fifth metatarsal head.  Gait: Unassisted, Nonantalgic.   No images are attached to the encounter.  Radiographs:  Date: 02/13/2022 XR the right foot Weightbearing AP/Lateral/Oblique   Findings: Attention directed to the  right lateral forefoot there is questionable osseous fragment in the plantar soft tissues below the fifth metatarsal head medial aspect as seen on AP view there is irregularity at the medial proximal part of the fifth metatarsal head that appears as if a second separate fragment but could also be an osseous spur.  On the lateral view it is not clear if this is close to the metatarsal head or in the plantar soft tissues.  No fracture of the fifth metatarsal head identified. Assessment:   1. Foreign body in right foot, initial encounter   2. Accessory tarsal bone of right foot   3. Bone spur of right foot   4. Neuroma      Plan:  Patient was evaluated and treated and all questions answered.  #Possible foreign body versus osseous fragment versus accessory ossicle or bone spur off the fifth metatarsal head -Discussed with the patient that I do feel a palpable osseous or foreign body present in the right plantar lateral forefoot under the fifth metatarsal head which I believe is causing the majority of her pain. -Do not feel there is any skin lesion hyperkeratotic lesion or porokeratosis present in the area based on clinical exam. -As x-rays are inconclusive I would like to send the patient for an MRI of the right forefoot to evaluate for foreign body or osseous fragment that could be causing her pain. -I did discuss that if there is found to be a object in the  area and will likely need to be removed surgically.  Discussed briefly what the surgery would involve as well as the postoperative course and risk benefits and alternatives. -Patient will follow-up in 4 weeks for recheck and to go over the MRI results.  In the meantime I want her to wear cushioned shoes and avoid going barefoot to reduce pain ibuprofen as needed for pain control.  Return in about 4 weeks (around 03/13/2022) for Follow up MRI results R foot.          Everitt Amber, DPM [facility]

## 2022-02-23 ENCOUNTER — Telehealth: Payer: Self-pay | Admitting: Podiatry

## 2022-02-23 NOTE — Telephone Encounter (Signed)
[facility] does all of there authorizations. We have never had to do one for them I spoke to someone over there last month about this and they confirmed it for me that they do their own PA's

## 2022-02-23 NOTE — Telephone Encounter (Signed)
need authorization for imaging sent over within the next 15 mins if possible. [facility] called as pt is scheduled for mri on Sunday.

## 2022-02-25 ENCOUNTER — Other Ambulatory Visit: Payer: Self-pay

## 2022-02-26 NOTE — Telephone Encounter (Signed)
No problem.

## 2022-02-26 NOTE — Telephone Encounter (Signed)
I confirmed with Maryjane Hurter who I have been dealing with for a Amoree Newlon time at [facility] and she conformed that they do all the PA's for the patients. She did also confirm that they have new staff there so that could have been the case as to why her PA wasn't done. Everything has been faxed over to them and they will get it taken care of.

## 2022-02-26 NOTE — Telephone Encounter (Signed)
Pt is calling back (using interpretation service) stating that she received a call on Friday stating that her MRI appt had been cancelled and she did not understand the [place] spoken as to why it was cancelled.   [facility] is the location that was needing the authorization.  Please advise

## 2022-03-13 ENCOUNTER — Telehealth: Payer: Self-pay | Admitting: Podiatry

## 2022-03-13 NOTE — Telephone Encounter (Signed)
Patient needs a prior auth for her MRI. He appointment was scheduled but they had to cx.  Can some please call patient back

## 2022-03-16 ENCOUNTER — Ambulatory Visit: Payer: Commercial Managed Care - HMO | Admitting: Podiatry

## 2022-03-19 NOTE — Telephone Encounter (Signed)
Order was placed on 02/13/22 and MRI was approved on by insurance. Patient needs to call and get her appt scheduled again Approval letter was scanned in the patients chart on 02/26/22.

## 2023-04-16 IMAGING — CT CT ABD-PELV W/ CM
2 of 5 series · 15 of 46 positions shown, 17 images · IV contrast (APPLIED)
Comparison: None Available.

CLINICAL DATA: Left lower quadrant pain for 2 days.

EXAM:
CT ABDOMEN AND PELVIS WITH CONTRAST
TECHNIQUE: Multidetector CT imaging of the abdomen and pelvis was performed
using the standard protocol following bolus administration of
intravenous contrast.

[Series 2: abd pel w · axial · 0.73mm/px · z∈[+746,+1141]mm · 12 of 89 slices shown, 14 images]
[im 5/89  soft-tissue]
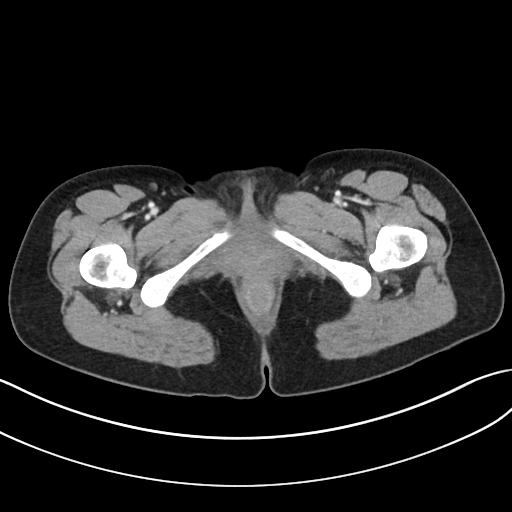
[im 5/89  bone]
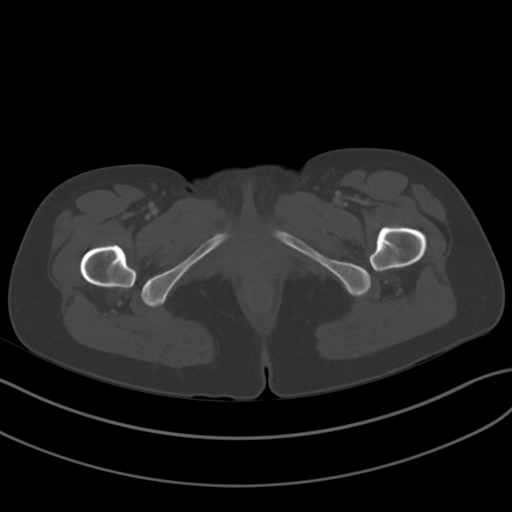
[im 14/89  soft-tissue]
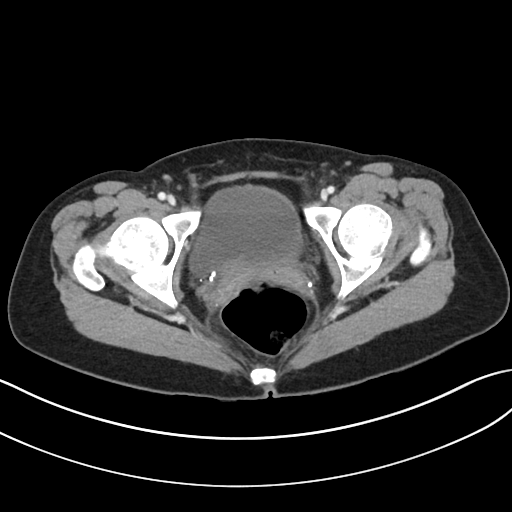
[im 19/89  soft-tissue]
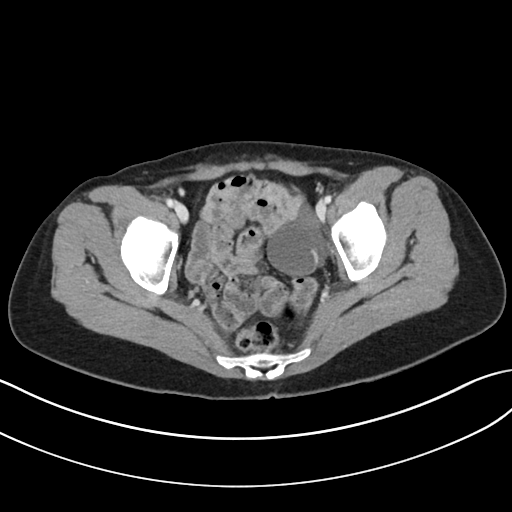
[im 28/89  soft-tissue]
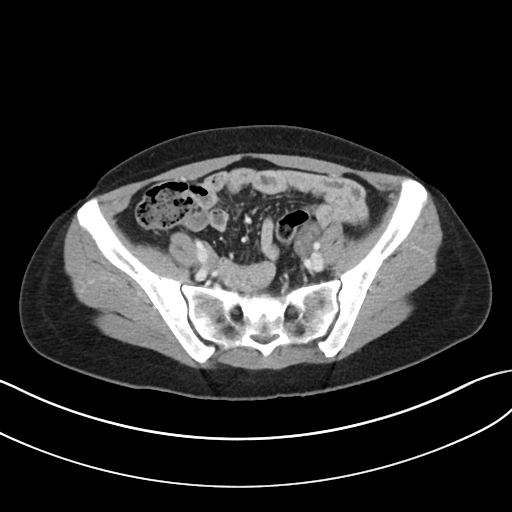
[im 33/89  soft-tissue]
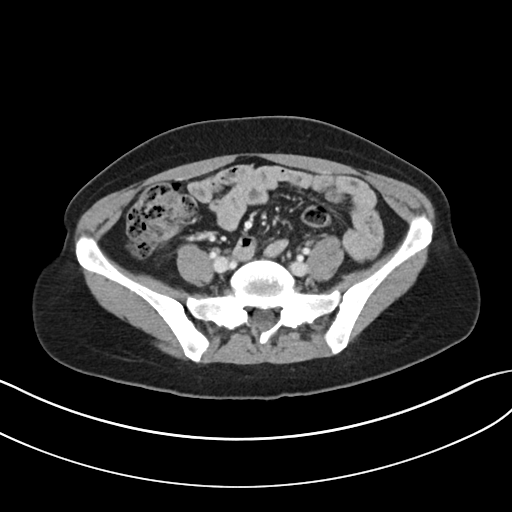
[im 42/89  soft-tissue]
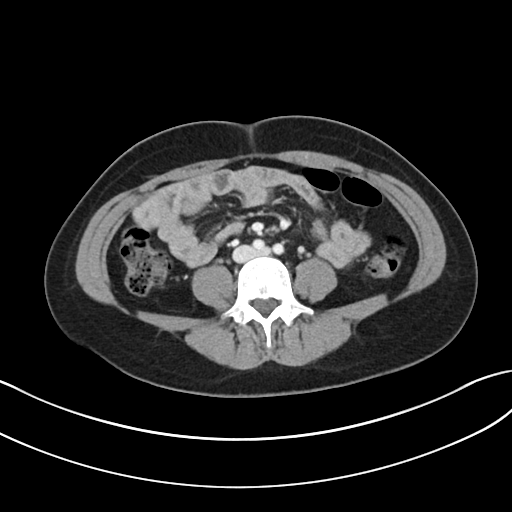
[im 47/89  soft-tissue]
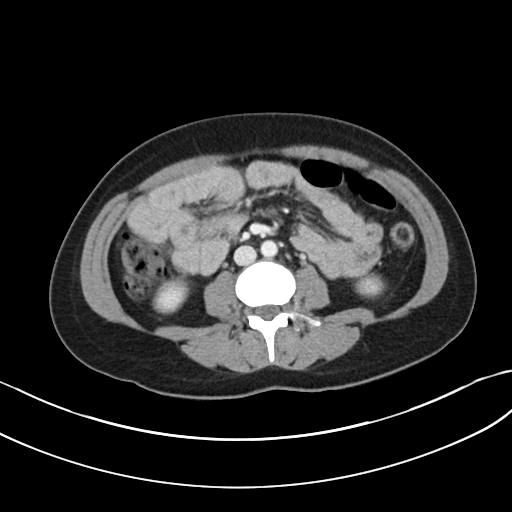
[im 56/89  soft-tissue]
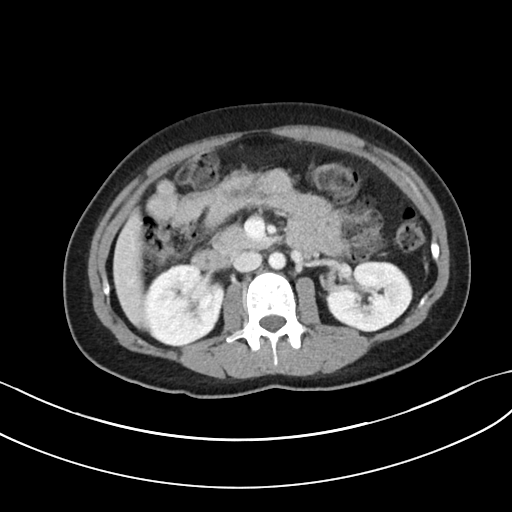
[im 61/89  soft-tissue]
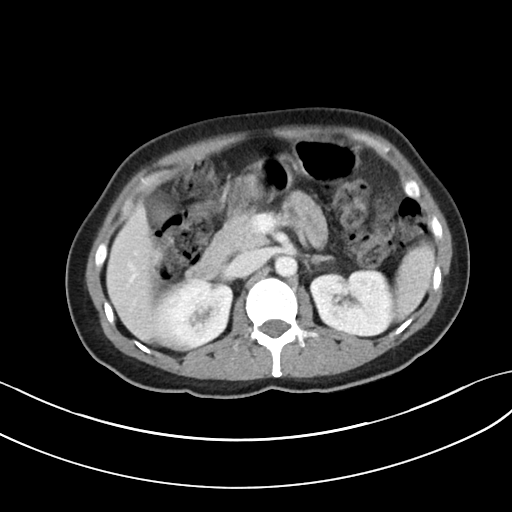
[im 61/89  bone]
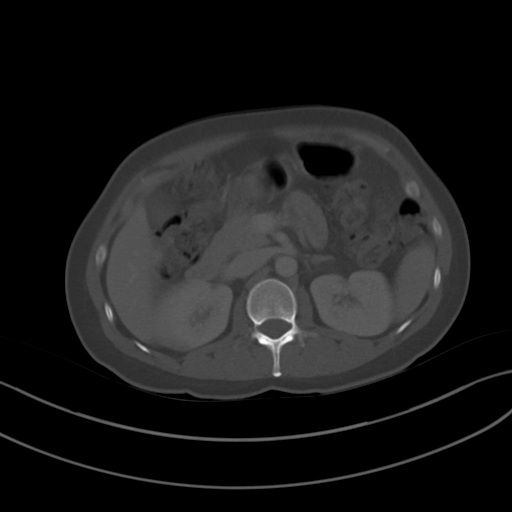
[im 70/89  soft-tissue]
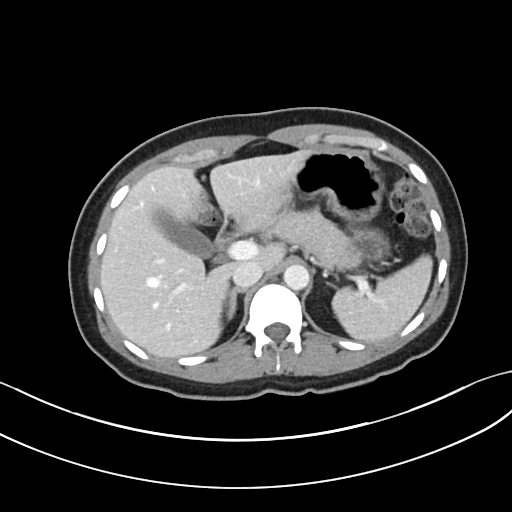
[im 75/89  soft-tissue]
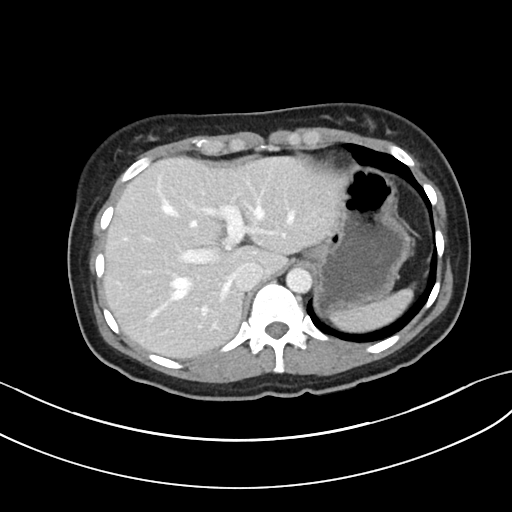
[im 84/89  soft-tissue]
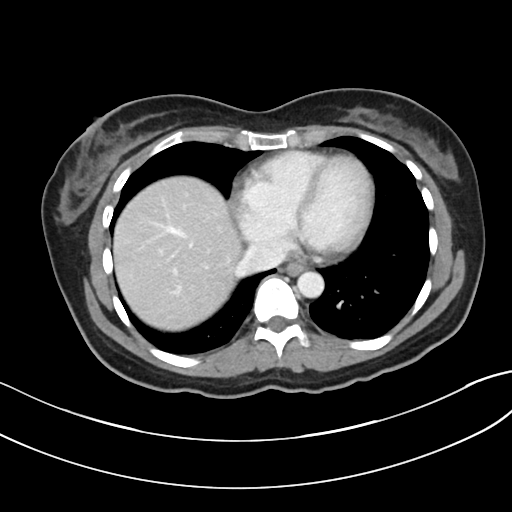

[Series 5: coronal · coronal · 0.64mm/px · 3 of 70 slices shown]
[im 24/70  soft-tissue]
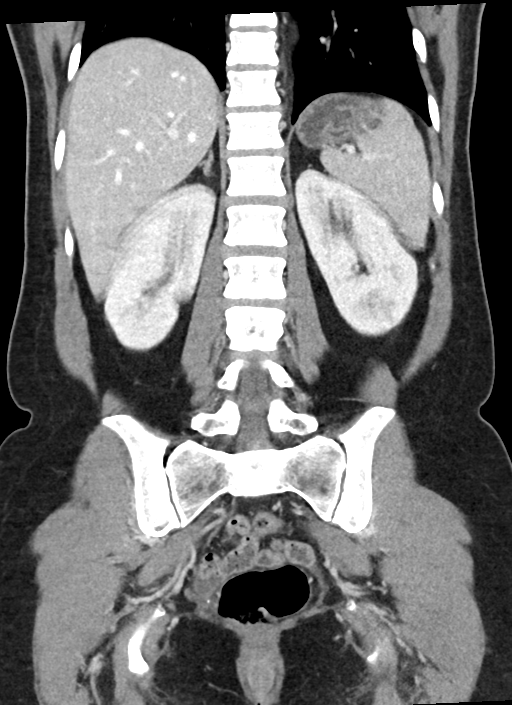
[im 31/70  soft-tissue]
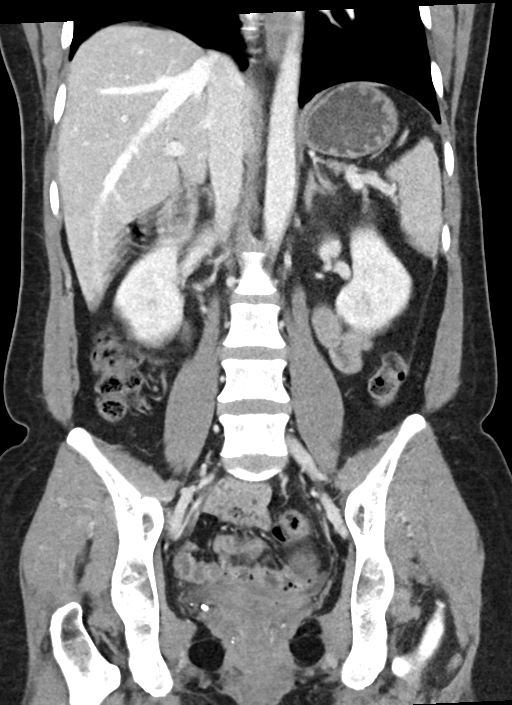
[im 39/70  soft-tissue]
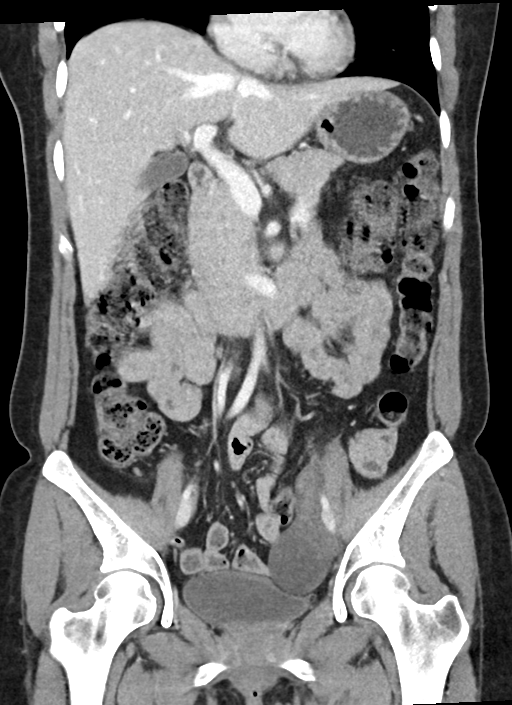

[15 of 46 positions shown; findings below may reference images not displayed]

RADIATION DOSE REDUCTION: This exam was performed according to the
departmental dose-optimization program which includes automated
exposure control, adjustment of the mA and/or kV according to
patient size and/or use of iterative reconstruction technique.

CONTRAST:  100mL OMNIPAQUE IOHEXOL 300 MG/ML  SOLN
FINDINGS: Lower Chest: No acute findings.

Hepatobiliary: No hepatic masses identified. Gallbladder is
unremarkable. No evidence of biliary ductal dilatation.

Pancreas:  No mass or inflammatory changes.

Spleen: Within normal limits in size and appearance.

Adrenals/Urinary Tract: No masses identified. No evidence of
ureteral calculi or hydronephrosis.

Stomach/Bowel: No evidence of obstruction, inflammatory process or
abnormal fluid collections. Normal appendix visualized.

Vascular/Lymphatic: No pathologically enlarged lymph nodes. No acute
vascular findings.

Reproductive: Prior hysterectomy. A benign-appearing left ovarian
cyst is seen which measures 3.9 x 3.6 cm. This is most likely
physiologic in a reproductive age female. No evidence of
inflammatory changes or abnormal fluid collections.

Other:  None.

Musculoskeletal:  No suspicious bone lesions identified.
IMPRESSION: 3.9 cm benign-appearing left ovarian cyst, most likely physiologic
in a reproductive age female. No follow-up imaging recommended.
Note: This recommendation does not apply to premenarchal patients
and to those with increased risk (genetic, family history, elevated
tumor markers or other high-risk factors) of ovarian cancer.
Reference: JACR [DATE]):[id number]

## 2023-10-16 ENCOUNTER — Ambulatory Visit (HOSPITAL_BASED_OUTPATIENT_CLINIC_OR_DEPARTMENT_OTHER)
Admission: EM | Admit: 2023-10-16 | Discharge: 2023-10-16 | Disposition: A | Discharge status: Home / Self Care | Payer: Self-pay

## 2023-10-16 ENCOUNTER — Encounter (HOSPITAL_BASED_OUTPATIENT_CLINIC_OR_DEPARTMENT_OTHER): Payer: Self-pay

## 2023-10-16 DIAGNOSIS — K219 Gastro-esophageal reflux disease without esophagitis: Secondary | ICD-10-CM

## 2023-10-16 MED ORDER — PANTOPRAZOLE SODIUM 40 MG PO TBEC: 40.0000 mg | DELAYED_RELEASE_TABLET | Freq: Two times a day (BID) | ORAL | 1 refills | Status: AC

## 2023-10-16 NOTE — ED Triage Notes (Signed)
 Patient states having reflux issues in June. Saw family doctor at [facility]. Had H Pylori test done which was positive. Referred to Gastroenterology and earliest appointment in December. Patient was placed on antibiotics and daily prilosec. States symptoms worsening. Unable to eat anything without significant reflux pain following. No pain at present. Skin p/w/d.

## 2023-10-16 NOTE — Discharge Instructions (Signed)
 Please stop the omeprazole and start the pantoprazole .  You will take this twice daily before meals at least 30 minutes before a meal with a glass of water.  Do this for the next 2 weeks at least.  Please follow-up with your doctor for any continued or worsening problems

## 2023-10-17 NOTE — ED Provider Notes (Signed)
 [facility]    CSN: [id number] Arrival date & time: 10/16/23  1921      History   Chief Complaint Chief Complaint  Patient presents with   Gastroesophageal Reflux    HPI Catherine Guerrero is a 48 y.o. female.   Pt is a 48 year old female that presents with GERD since June. Was seen by  family doctor at [facility]. Had H Pylori test done which was positive. Referred to Gastroenterology and earliest appointment in December. Patient was placed on antibiotics and daily prilosec. She has finished the abx.  States symptoms worsening. Unable to eat anything without significant reflux pain following. No pain at present. Losing weight. No abdominal pain.    Gastroesophageal Reflux    Past Medical History:  Diagnosis Date   COVID-19    DM (diabetes mellitus) ([facility])    Hypotension     There are no active problems to display for this patient.   Past Surgical History:  Procedure Laterality Date   OOPHORECTOMY  2000    OB History   No obstetric history on file.      Home Medications    Prior to Admission medications   Medication Sig Start Date End Date Taking? Authorizing Provider  pantoprazole  (PROTONIX ) 40 MG tablet Take 1 tablet (40 mg total) by mouth 2 (two) times daily before a meal. 10/16/23  Yes Sansa Alkema A, FNP  Albuterol Sulfate (PROAIR RESPICLICK) 108 (90 Base) MCG/ACT AEPB Inhale 2 puffs into the lungs every 4 (four) hours as needed.    [provider]  glipiZIDE (GLUCOTROL) 5 MG tablet Take 5 mg by mouth 2 (two) times daily.    [provider]  ibuprofen  (ADVIL ) 600 MG tablet Take 1 tablet (600 mg total) by mouth every 6 (six) hours as needed. 07/16/21   Nivia Colon, PA-C  metFORMIN (GLUCOPHAGE) 1000 MG tablet Take 1,000 mg by mouth 2 (two) times daily with a meal.    [provider]  metoprolol  tartrate (LOPRESSOR ) 100 MG tablet Take 1 tablet (100 mg total) by mouth once for 1 dose. Take two hours prior to cardiac  CT 08/14/19 08/14/19  Tobb, Kardie, DO    Family History History reviewed. No pertinent family history.  Social History Social History   Tobacco Use   Smoking status: Never   Smokeless tobacco: Never  Substance Use Topics   Alcohol use: Never   Drug use: Never     Allergies   Patient has no known allergies.   Review of Systems Review of Systems See HPI  Physical Exam Triage Vital Signs ED Triage Vitals  Encounter Vitals Group     BP 10/16/23 1934 112/75     Girls Systolic BP Percentile --      Girls Diastolic BP Percentile --      Boys Systolic BP Percentile --      Boys Diastolic BP Percentile --      Pulse Rate 10/16/23 1934 70     Resp 10/16/23 1934 20     Temp 10/16/23 1934 98.5 F (36.9 C)     Temp Source 10/16/23 1934 Oral     SpO2 10/16/23 1934 98 %     Weight --      Height --      Head Circumference --      Peak Flow --      Pain Score 10/16/23 1935 0     Pain Loc --  Pain Education --      Exclude from Growth Chart --    No data found.  Updated Vital Signs BP 112/75 (BP Location: Right Arm)   Pulse 70   Temp 98.5 F (36.9 C) (Oral)   Resp 20   SpO2 98%   Visual Acuity Right Eye Distance:   Left Eye Distance:   Bilateral Distance:    Right Eye Near:   Left Eye Near:    Bilateral Near:     Physical Exam Vitals and nursing note reviewed.  Constitutional:      General: She is not in acute distress.    Appearance: Normal appearance. She is not ill-appearing, toxic-appearing or diaphoretic.  Pulmonary:     Effort: Pulmonary effort is normal.  Abdominal:     Tenderness: There is no abdominal tenderness.  Neurological:     Mental Status: She is alert.  Psychiatric:        Mood and Affect: Mood normal.      UC Treatments / Results  Labs (all labs ordered are listed, but only abnormal results are displayed) Labs Reviewed - No data to display  EKG   Radiology No results found.  Procedures Procedures (including critical  care time)  Medications Ordered in UC Medications - No data to display  Initial Impression / Assessment and Plan / UC Course  I have reviewed the triage vital signs and the nursing notes.  Pertinent labs & imaging results that were available during my care of the patient were reviewed by me and considered in my medical decision making (see chart for details).     GERD- She is currently not having any discomfort. It is only after she eats. No abdominal pain only acid.  I am switching her from Omeprazole to Pantoprazole  twice daily AC.  Hopefully this will help. She has already been eating a bland diet.  Recommend call around to GI and see if she can get in sooner than December.   Final Clinical Impressions(s) / UC Diagnoses   Final diagnoses:  Gastroesophageal reflux disease, unspecified whether esophagitis present     Discharge Instructions      Please stop the omeprazole and start the pantoprazole .  You will take this twice daily before meals at least 30 minutes before a meal with a glass of water.  Do this for the next 2 weeks at least.  Please follow-up with your doctor for any continued or worsening problems    ED Prescriptions     Medication Sig Dispense Auth. Provider   pantoprazole  (PROTONIX ) 40 MG tablet Take 1 tablet (40 mg total) by mouth 2 (two) times daily before a meal. 60 tablet Anmol Fleck A, FNP      PDMP not reviewed this encounter.   Adah Wilbert LABOR, FNP 10/17/23 1315
